# Patient Record
Sex: Male | Born: 1990 | Race: Black or African American | Hispanic: No | Marital: Married | Smoking: Never smoker
Health system: Southern US, Community
[De-identification: ages and names within clinical notes are randomized; demographics above are authoritative.]

---

## 2013-02-10 ENCOUNTER — Ambulatory Visit: Payer: Self-pay | Admitting: Family Medicine

## 2014-03-15 ENCOUNTER — Emergency Department: Payer: Self-pay | Admitting: Emergency Medicine

## 2014-05-04 ENCOUNTER — Emergency Department
Admit: 2014-05-04 | Disposition: A | Discharge status: Home / Self Care | Payer: Self-pay | Admitting: Emergency Medicine

## 2014-05-04 LAB — CBC WITH DIFFERENTIAL/PLATELET
BASOS ABS: 0 10*3/uL (ref 0.0–0.1)
Basophil %: 0.9 %
Eosinophil #: 0.2 10*3/uL (ref 0.0–0.7)
Eosinophil %: 4 %
HCT: 44.4 % (ref 40.0–52.0)
HGB: 14.6 g/dL (ref 13.0–18.0)
LYMPHS ABS: 1.9 10*3/uL (ref 1.0–3.6)
LYMPHS PCT: 34.7 %
MCH: 29.1 pg (ref 26.0–34.0)
MCHC: 32.8 g/dL (ref 32.0–36.0)
MCV: 89 fL (ref 80–100)
Monocyte #: 0.5 x10 3/mm (ref 0.2–1.0)
Monocyte %: 9 %
Neutrophil #: 2.9 10*3/uL (ref 1.4–6.5)
Neutrophil %: 51.4 %
Platelet: 185 10*3/uL (ref 150–440)
RBC: 5 10*6/uL (ref 4.40–5.90)
RDW: 13.9 % (ref 11.5–14.5)
WBC: 5.6 10*3/uL (ref 3.8–10.6)

## 2014-05-04 LAB — BASIC METABOLIC PANEL
Anion Gap: 7 (ref 7–16)
BUN: 13 mg/dL
CALCIUM: 9.1 mg/dL
Chloride: 104 mmol/L
Co2: 28 mmol/L
Creatinine: 0.95 mg/dL
EGFR (African American): >60
EGFR (Non-African Amer.): >60
Glucose: 96 mg/dL
Potassium: 3.4 mmol/L — ABNORMAL LOW
Sodium: 139 mmol/L

## 2014-05-04 LAB — TROPONIN I: Troponin-I: <0.03 ng/mL

## 2014-05-20 ENCOUNTER — Ambulatory Visit: Payer: Self-pay | Admitting: Cardiovascular Disease

## 2014-05-20 ENCOUNTER — Encounter: Payer: Self-pay | Admitting: *Deleted

## 2015-04-12 ENCOUNTER — Encounter: Payer: Self-pay | Admitting: Emergency Medicine

## 2015-04-12 ENCOUNTER — Ambulatory Visit (INDEPENDENT_AMBULATORY_CARE_PROVIDER_SITE_OTHER): Payer: Worker's Compensation

## 2015-04-12 ENCOUNTER — Ambulatory Visit
Admission: EM | Admit: 2015-04-12 | Discharge: 2015-04-12 | Disposition: A | Discharge status: Home / Self Care | Payer: Worker's Compensation | Attending: Family Medicine | Admitting: Family Medicine

## 2015-04-12 DIAGNOSIS — S51811A Laceration without foreign body of right forearm, initial encounter: Secondary | ICD-10-CM

## 2015-04-12 MED ORDER — AZITHROMYCIN 500 MG PO TABS: 500.0000 mg | ORAL_TABLET | Freq: Every day | ORAL | Status: DC

## 2015-04-12 NOTE — ED Provider Notes (Signed)
CSN: [id number]     Arrival date & time 04/12/15  1021 History   First MD Initiated Contact with Patient 04/12/15 1228     Chief Complaint  Patient presents with  . Laceration   (Consider location/radiation/quality/duration/timing/severity/associated sxs/prior Treatment) HPI: Patient presents today with laceration to the right forearm. Patient states that he was cut with glass at work. He states that he was putting a glass frame in the been when it popped up and hit his forearm. He is at work at the time. He denies any other injury. He states his tetanus immunization is up-to-date.  History reviewed. No pertinent past medical history. History reviewed. No pertinent past surgical history. No family history on file. Social History  Substance Use Topics  . Smoking status: Never Smoker   . Smokeless tobacco: None  . Alcohol Use: No    Review of Systems: Negative except mentioned above.   Allergies  Penicillins  Home Medications   Prior to Admission medications   Not on File   Meds Ordered and Administered this Visit  Medications - No data to display  BP 144/95 mmHg  Pulse 86  Temp(Src) 97.8 F (36.6 C) (Oral)  Resp 20  SpO2 100% No data found.   Physical Exam  GENERAL: NAD SKIN: approx. 1.5 in. jagged laceration to the right volar forearm with some swelling around the site, FROM of the RUE including wrist and digits, nv intact  NEURO: CN II-XII grossly intact   ED Course  Procedures (including critical care time)  Labs Review Labs Reviewed - No data to display  Imaging Review No results found.     MDM  A/P: Laceration R Forearm- Risk/benefits discussed. Verbal consent given. Sterile technique used. The area was cleaned initially and lidocaine 1% without epi was used to anesthetize the area. 5-0 nylon was used to close the site with 4 sutures. Patient tolerated procedure well. Dressing was placed. Discussed with patient that I would recommend that the sutures  come out between 10-14 days. Patient will follow-up in 10 days. Will put patient on Zithromax prophy. for a few days given the fact that the patient is allergic to penicillin. Tylenol when necessary pain. Keep the area dry and clean. If any signs of infection or problems he is to follow-up.    Jolene ProvostKirtida Lianny Molter, MD 04/12/15 1505

## 2015-04-12 NOTE — ED Notes (Signed)
Pt with laceration to right forearm. Cut with glass, happened at work and will be [employer].

## 2015-05-01 ENCOUNTER — Ambulatory Visit
Admission: EM | Admit: 2015-05-01 | Discharge: 2015-05-01 | Disposition: A | Discharge status: Home / Self Care | Payer: Worker's Compensation

## 2015-05-01 ENCOUNTER — Encounter: Payer: Self-pay | Admitting: *Deleted

## 2015-05-01 NOTE — ED Notes (Signed)
Pt needs sutures removed from his right arm.  Pt received sutures from a workers comp incident on 04/12/15.

## 2016-03-28 ENCOUNTER — Emergency Department
Admission: EM | Admit: 2016-03-28 | Discharge: 2016-03-29 | Disposition: A | Discharge status: Home / Self Care | Payer: Self-pay | Attending: Emergency Medicine | Admitting: Emergency Medicine

## 2016-03-28 ENCOUNTER — Encounter: Payer: Self-pay | Admitting: Emergency Medicine

## 2016-03-28 DIAGNOSIS — Z5321 Procedure and treatment not carried out due to patient leaving prior to being seen by health care provider: Secondary | ICD-10-CM | POA: Insufficient documentation

## 2016-03-28 DIAGNOSIS — R21 Rash and other nonspecific skin eruption: Secondary | ICD-10-CM | POA: Insufficient documentation

## 2016-03-28 NOTE — ED Triage Notes (Signed)
Pt ambulatory to triage in NAD, reports his girlfriend saw something on pt's back and thought it's a staph infection.  Pt's back inspected, no abnormalities seen. Back palpated, no tenderness.  Pt denies any sx

## 2017-02-20 IMAGING — CR DG FOREARM 2V*R*
2 series · 2 of 2 positions shown · non-contrast
Comparison: None.

CLINICAL DATA: Right forearm laceration on glass at work. Initial
encounter.

EXAM:
RIGHT FOREARM - 2 VIEW

[forearm ap]
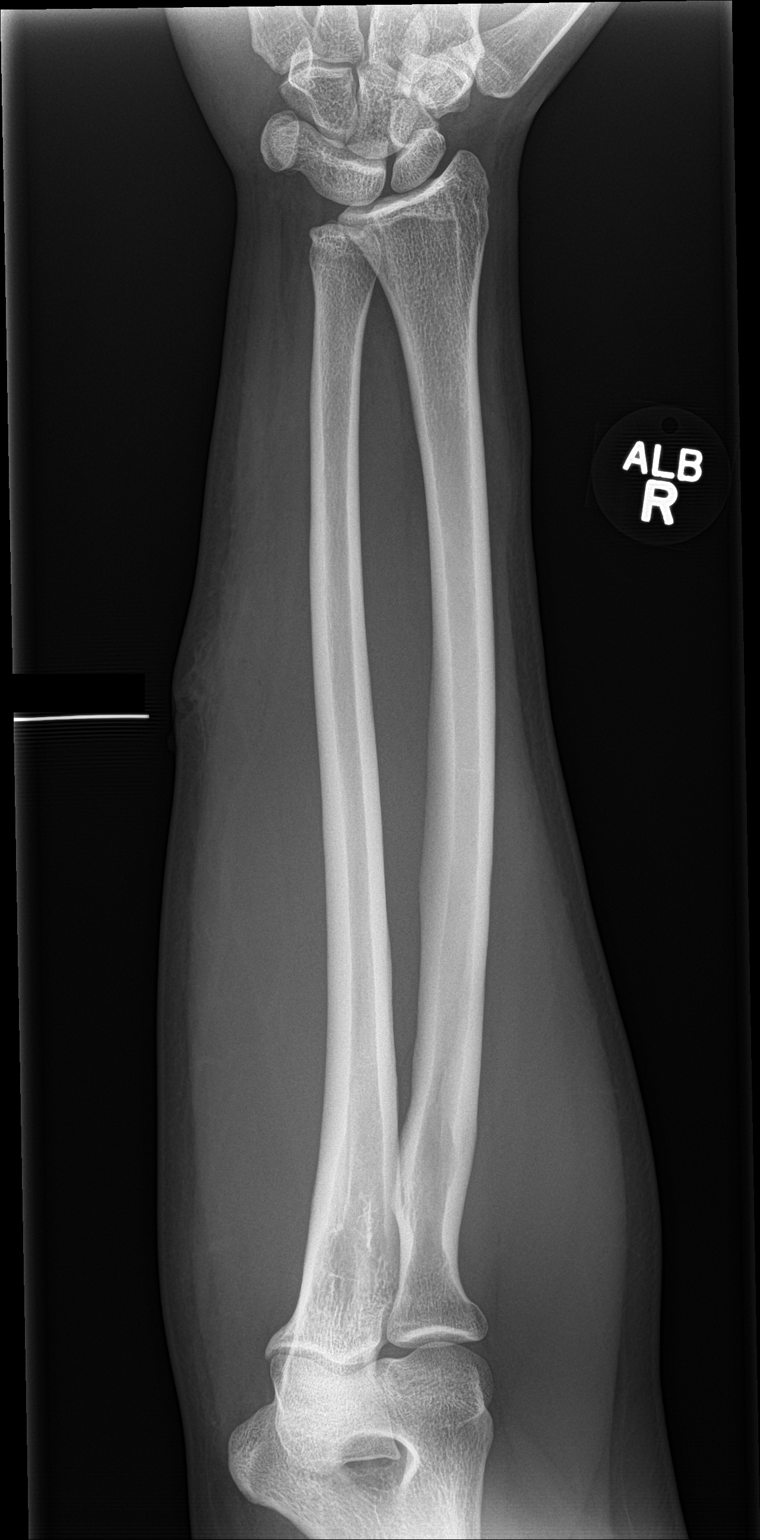

[forearm lat]
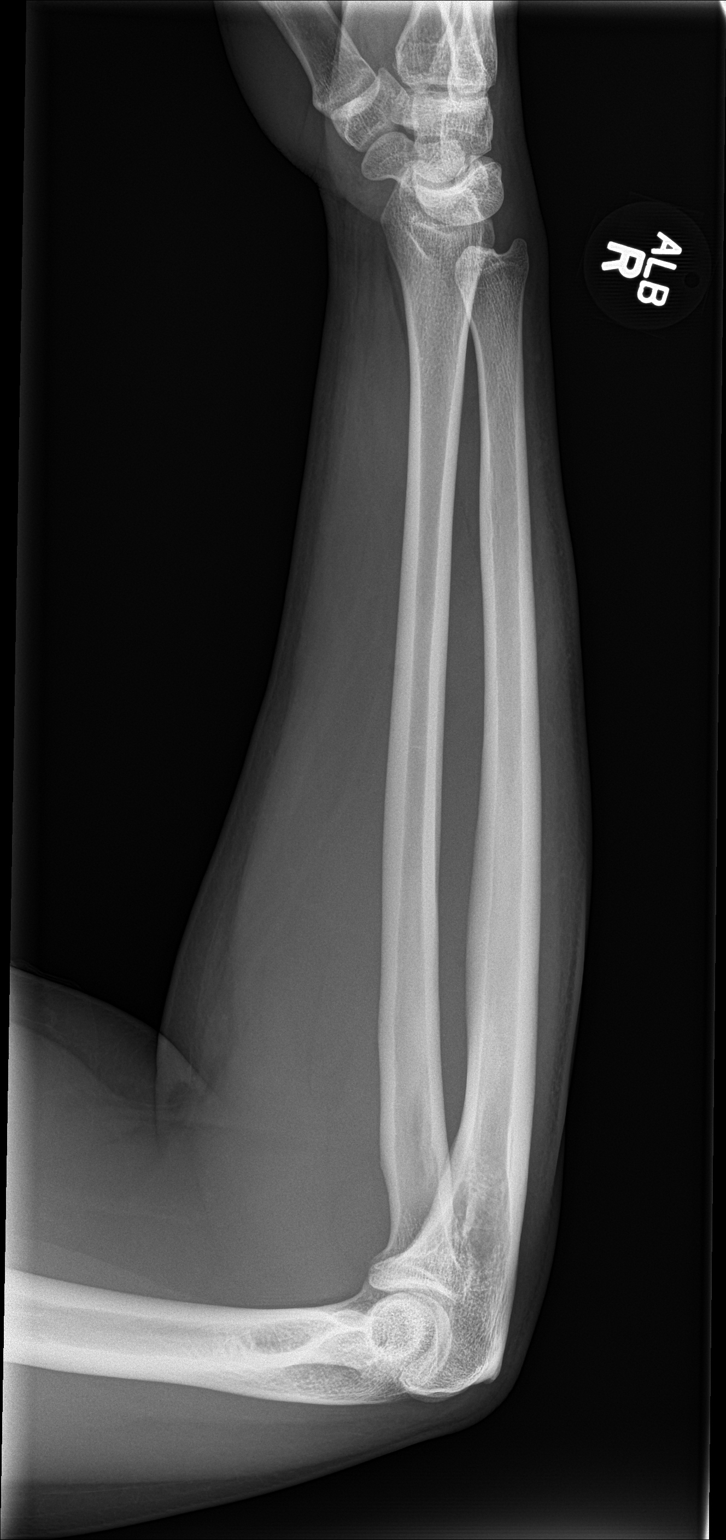

[2 of 2 positions shown; findings below may reference images not displayed]

FINDINGS: There is no evidence of fracture or other focal bone lesions. No
radiopaque foreign body is noted. Laceration is seen in soft tissues
medially.
IMPRESSION: No fracture or dislocation is noted. No radiopaque foreign body is
noted.

## 2018-08-12 ENCOUNTER — Emergency Department
Admission: EM | Admit: 2018-08-12 | Discharge: 2018-08-12 | Disposition: A | Discharge status: Home / Self Care | Payer: Self-pay | Attending: Emergency Medicine | Admitting: Emergency Medicine

## 2018-08-12 ENCOUNTER — Other Ambulatory Visit: Payer: Self-pay

## 2018-08-12 ENCOUNTER — Encounter: Payer: Self-pay | Admitting: Emergency Medicine

## 2018-08-12 DIAGNOSIS — L03317 Cellulitis of buttock: Secondary | ICD-10-CM | POA: Insufficient documentation

## 2018-08-12 MED ORDER — CEPHALEXIN 500 MG PO CAPS: 500.0000 mg | ORAL_CAPSULE | Freq: Three times a day (TID) | ORAL | 0 refills | Status: DC

## 2018-08-12 MED ORDER — SULFAMETHOXAZOLE-TRIMETHOPRIM 800-160 MG PO TABS: 1.0000 | ORAL_TABLET | Freq: Two times a day (BID) | ORAL | 0 refills | Status: DC

## 2018-08-12 NOTE — ED Triage Notes (Signed)
Pt presents to ED via POV with c/o feeling like his tailbone is bruised x several days. Pt denies known injury at this time. Pt ambulatory without difficulty at this time.

## 2018-08-12 NOTE — ED Provider Notes (Signed)
[facility] Emergency Department Provider Note  ____________________________________________  Time seen: Approximately 8:35 PM  I have reviewed the triage vital signs and the nursing notes.   HISTORY  Chief Complaint Back Pain    HPI William Gilbert is a 28 y.o. male presents to the emergency department with concern for a lump along the left buttocks.  Patient states that area has been tender to the touch.  He denies falls or mechanisms of trauma.  Denies having an abscess in location in the past.  Denies a history of recent cutaneous infections.  No alleviating measures have been attempted at home.         History reviewed. No pertinent past medical history.  There are no active problems to display for this patient.   History reviewed. No pertinent surgical history.  Prior to Admission medications   Medication Sig Start Date End Date Taking? Authorizing Provider  azithromycin (ZITHROMAX) 500 MG tablet Take 1 tablet (500 mg total) by mouth daily. 04/12/15   Paulina Fusi, MD  cephALEXin (KEFLEX) 500 MG capsule Take 1 capsule (500 mg total) by mouth 3 (three) times daily for 7 days. 08/12/18 08/19/18  Lannie Fields, PA-C  sulfamethoxazole-trimethoprim (BACTRIM DS) 800-160 MG tablet Take 1 tablet by mouth 2 (two) times daily for 7 days. 08/12/18 08/19/18  Lannie Fields, PA-C    Allergies Penicillins  No family history on file.  Social History Social History   Tobacco Use  . Smoking status: Never Smoker  . Smokeless tobacco: Never Used  Substance Use Topics  . Alcohol use: Yes  . Drug use: Not on file     Review of Systems  Constitutional: No fever/chills Eyes: No visual changes. No discharge ENT: No upper respiratory complaints. Cardiovascular: no chest pain. Respiratory: no cough. No SOB. Gastrointestinal: No abdominal pain.  No nausea, no vomiting.  No diarrhea.  No constipation. Musculoskeletal: Negative for musculoskeletal  pain. Skin: Patient has approximately 2 cm x 2 cm of left buttocks cellulitis. Neurological: Negative for headaches, focal weakness or numbness.   ____________________________________________   PHYSICAL EXAM:  VITAL SIGNS: ED Triage Vitals  Enc Vitals Group     BP 08/12/18 1842 135/88     Pulse Rate 08/12/18 1842 83     Resp 08/12/18 1842 18     Temp 08/12/18 1842 98.3 F (36.8 C)     Temp Source 08/12/18 1842 Oral     SpO2 08/12/18 1842 98 %     Weight 08/12/18 1840 240 lb (108.9 kg)     Height 08/12/18 1840 5\' 8"  (1.727 m)     Head Circumference --      Peak Flow --      Pain Score 08/12/18 1840 5     Pain Loc --      Pain Edu? --      Excl. in [place]? --      Constitutional: Alert and oriented. Well appearing and in no acute distress. Eyes: Conjunctivae are normal. PERRL. EOMI. Head: Atraumatic. Cardiovascular: Normal rate, regular rhythm. Normal S1 and S2.  Good peripheral circulation. Respiratory: Normal respiratory effort without tachypnea or retractions. Lungs CTAB. Good air entry to the bases with no decreased or absent breath sounds. Musculoskeletal: Full range of motion to all extremities. No gross deformities appreciated. Neurologic:  Normal speech and language. No gross focal neurologic deficits are appreciated.  Skin: Patient has 2 cm x 2 cm region of left buttock cellulitis.  There is mild induration  but no palpable fluctuance. Psychiatric: Mood and affect are normal. Speech and behavior are normal. Patient exhibits appropriate insight and judgement.   ____________________________________________   LABS (all labs ordered are listed, but only abnormal results are displayed)  Labs Reviewed - No data to display ____________________________________________  EKG   ____________________________________________  RADIOLOGY  No results found.  ____________________________________________    PROCEDURES  Procedure(s) performed:     Procedures    Medications - No data to display   ____________________________________________   INITIAL IMPRESSION / ASSESSMENT AND PLAN / ED COURSE  Pertinent labs & imaging results that were available during my care of the patient were reviewed by me and considered in my medical decision making (see chart for details).  Review of the [place] CSRS was performed in accordance of the [facility] prior to dispensing any controlled drugs.         Assessment and plan Buttock cellulitis 28 year old male presents to the emergency department with pain of the left buttocks.   Vital signs were stable at triage.  Patient was afebrile.   On physical exam, patient had a 2 cm x 2 cm region of mild induration and erythema consistent with cellulitis of the left buttocks. I am concerned for early abscess formation but affected area is not conducive to incision and drainage at this time.  Patient was started empirically on Bactrim and Keflex.  Patient was cautioned to return to the emergency department if symptoms worsen.  He felt comfortable being discharged from the emergency department.  All patient questions were answered.   ____________________________________________  FINAL CLINICAL IMPRESSION(S) / ED DIAGNOSES  Final diagnoses:  Cellulitis of buttock      NEW MEDICATIONS STARTED DURING THIS VISIT:  ED Discharge Orders         Ordered    sulfamethoxazole-trimethoprim (BACTRIM DS) 800-160 MG tablet  2 times daily     08/12/18 2032    cephALEXin (KEFLEX) 500 MG capsule  3 times daily     08/12/18 2032              This chart was dictated using voice recognition software/Dragon. Despite best efforts to proofread, errors can occur which can change the meaning. Any change was purely unintentional.    Gasper LloydWoods, Darsi Tien M, PA-C 08/12/18 2038    Concha SeFunke, Mary E, MD 08/13/18 [phone]

## 2018-08-14 ENCOUNTER — Encounter: Payer: Self-pay | Admitting: Emergency Medicine

## 2018-08-14 ENCOUNTER — Other Ambulatory Visit: Payer: Self-pay

## 2018-08-14 DIAGNOSIS — L0501 Pilonidal cyst with abscess: Secondary | ICD-10-CM | POA: Insufficient documentation

## 2018-08-14 DIAGNOSIS — L0231 Cutaneous abscess of buttock: Secondary | ICD-10-CM | POA: Diagnosis present

## 2018-08-14 LAB — COMPREHENSIVE METABOLIC PANEL
ALT: 46 U/L — ABNORMAL HIGH (ref 0–44)
AST: 31 U/L (ref 15–41)
Albumin: 4.6 g/dL (ref 3.5–5.0)
Alkaline Phosphatase: 70 U/L (ref 38–126)
Anion gap: 12 (ref 5–15)
BUN: 16 mg/dL (ref 6–20)
CO2: 24 mmol/L (ref 22–32)
Calcium: 9.3 mg/dL (ref 8.9–10.3)
Chloride: 100 mmol/L (ref 98–111)
Creatinine, Ser: 1.31 mg/dL — ABNORMAL HIGH (ref 0.61–1.24)
GFR calc Af Amer: >60 mL/min (ref >60)
GFR calc non Af Amer: >60 mL/min (ref >60)
Glucose, Bld: 112 mg/dL — ABNORMAL HIGH (ref 70–99)
Potassium: 3.9 mmol/L (ref 3.5–5.1)
Sodium: 136 mmol/L (ref 135–145)
Total Bilirubin: 0.9 mg/dL (ref 0.3–1.2)
Total Protein: 8.5 g/dL — ABNORMAL HIGH (ref 6.5–8.1)

## 2018-08-14 LAB — CBC WITH DIFFERENTIAL/PLATELET
Abs Immature Granulocytes: 0.03 10*3/uL (ref 0.00–0.07)
Basophils Absolute: 0.1 10*3/uL (ref 0.0–0.1)
Basophils Relative: 1 %
Eosinophils Absolute: 0.2 10*3/uL (ref 0.0–0.5)
Eosinophils Relative: 2 %
HCT: 43.8 % (ref 39.0–52.0)
Hemoglobin: 14.5 g/dL (ref 13.0–17.0)
Immature Granulocytes: 0 %
Lymphocytes Relative: 20 %
Lymphs Abs: 2.5 10*3/uL (ref 0.7–4.0)
MCH: 28.6 pg (ref 26.0–34.0)
MCHC: 33.1 g/dL (ref 30.0–36.0)
MCV: 86.4 fL (ref 80.0–100.0)
Monocytes Absolute: 1.3 10*3/uL — ABNORMAL HIGH (ref 0.1–1.0)
Monocytes Relative: 10 %
Neutro Abs: 8.7 10*3/uL — ABNORMAL HIGH (ref 1.7–7.7)
Neutrophils Relative %: 67 %
Platelets: 245 10*3/uL (ref 150–400)
RBC: 5.07 MIL/uL (ref 4.22–5.81)
RDW: 12.7 % (ref 11.5–15.5)
WBC: 12.8 10*3/uL — ABNORMAL HIGH (ref 4.0–10.5)
nRBC: 0 % (ref 0.0–0.2)

## 2018-08-14 LAB — LACTIC ACID, PLASMA: Lactic Acid, Venous: 1.2 mmol/L (ref 0.5–1.9)

## 2018-08-14 MED ORDER — ACETAMINOPHEN 325 MG PO TABS
650.0000 mg | ORAL_TABLET | Freq: Once | ORAL | Status: AC | PRN
  Administered 2018-08-14: 650 mg via ORAL
  Filled 2018-08-14: qty 2

## 2018-08-14 NOTE — ED Notes (Signed)
Sent redraw green top to lab

## 2018-08-14 NOTE — ED Triage Notes (Addendum)
Pt here today for abscess like area to left upper buttocks.  Has increased in size despite abx started 2 days ago from here (bactrim/kelflex).  Pt has noted fever in triage although denied known fever.  No other symptoms other than pain from abscess.  No vomiting. Did not meet sepsis criteria on triage screen but first set blood cx drawn and sent to lab if needed with lab draw.

## 2018-08-15 ENCOUNTER — Emergency Department
Admission: EM | Admit: 2018-08-15 | Discharge: 2018-08-15 | Disposition: A | Discharge status: Home / Self Care | Payer: BC Managed Care – PPO | Attending: Emergency Medicine | Admitting: Emergency Medicine

## 2018-08-15 DIAGNOSIS — L0501 Pilonidal cyst with abscess: Secondary | ICD-10-CM

## 2018-08-15 MED ORDER — OXYCODONE-ACETAMINOPHEN 5-325 MG PO TABS
2.0000 | ORAL_TABLET | Freq: Once | ORAL | Status: AC
  Administered 2018-08-15: 2 via ORAL
  Filled 2018-08-15: qty 2

## 2018-08-15 MED ORDER — METHYLPREDNISOLONE SODIUM SUCC 125 MG IJ SOLR
INTRAMUSCULAR | Status: AC
  Filled 2018-08-15: qty 2

## 2018-08-15 MED ORDER — DOCUSATE SODIUM 100 MG PO CAPS: 100.0000 mg | ORAL_CAPSULE | Freq: Every day | ORAL | 2 refills | Status: AC | PRN

## 2018-08-15 MED ORDER — DIPHENHYDRAMINE HCL 50 MG/ML IJ SOLN
25.0000 mg | Freq: Once | INTRAMUSCULAR | Status: AC
  Administered 2018-08-15: 02:00:00 25 mg via INTRAVENOUS

## 2018-08-15 MED ORDER — HYDROMORPHONE HCL 1 MG/ML IJ SOLN
0.5000 mg | Freq: Once | INTRAMUSCULAR | Status: AC
  Administered 2018-08-15: 0.5 mg via INTRAVENOUS
  Filled 2018-08-15: qty 1

## 2018-08-15 MED ORDER — DOCUSATE SODIUM 100 MG PO CAPS
100.0000 mg | ORAL_CAPSULE | Freq: Once | ORAL | Status: AC
  Administered 2018-08-15: 100 mg via ORAL
  Filled 2018-08-15: qty 1

## 2018-08-15 MED ORDER — DIPHENHYDRAMINE HCL 50 MG/ML IJ SOLN
INTRAMUSCULAR | Status: AC
  Administered 2018-08-15: 25 mg via INTRAVENOUS
  Filled 2018-08-15: qty 1

## 2018-08-15 MED ORDER — VANCOMYCIN HCL IN DEXTROSE 1-5 GM/200ML-% IV SOLN
1000.0000 mg | Freq: Once | INTRAVENOUS | Status: AC
  Administered 2018-08-15: 1000 mg via INTRAVENOUS
  Filled 2018-08-15: qty 200

## 2018-08-15 MED ORDER — LIDOCAINE-EPINEPHRINE 1 %-1:100000 IJ SOLN
10.0000 mL | Freq: Once | INTRAMUSCULAR | Status: DC
  Filled 2018-08-15: qty 10

## 2018-08-15 MED ORDER — OXYCODONE-ACETAMINOPHEN 5-325 MG PO TABS: 1.0000 | ORAL_TABLET | Freq: Three times a day (TID) | ORAL | 0 refills | Status: DC | PRN

## 2018-08-15 NOTE — ED Notes (Addendum)
Pt called out reporting that he is having an allergic reaction to his antibiotic. IV stopped. MD aware.

## 2018-08-15 NOTE — ED Provider Notes (Signed)
[facility] Emergency Department Provider Note       Time seen: ----------------------------------------- 12:27 AM on 08/15/2018 -----------------------------------------   I have reviewed the triage vital signs and the nursing notes.  HISTORY   Chief Complaint Back Pain    HPI William Gilbert is a 28 y.o. male with no known past medical history who presents to the ED for an abscess on his left upper buttocks.  Patient states this is increased in size despite antibiotics that were started 2 days ago.  He had a fever on arrival here although he did not know that he had one.  History reviewed. No pertinent past medical history.  There are no active problems to display for this patient.   History reviewed. No pertinent surgical history.  Allergies Penicillins  Social History Social History   Tobacco Use  . Smoking status: Never Smoker  . Smokeless tobacco: Never Used  Substance Use Topics  . Alcohol use: Yes  . Drug use: Not on file    Review of Systems Constitutional: Positive for fever Cardiovascular: Negative for chest pain. Respiratory: Negative for shortness of breath. Gastrointestinal: Negative for abdominal pain, vomiting and diarrhea. Musculoskeletal: Positive for buttocks pain Skin: Positive for skin abscess Neurological: Negative for headaches, focal weakness or numbness.  All systems negative/normal/unremarkable except as stated in the HPI  ____________________________________________   PHYSICAL EXAM:  VITAL SIGNS: ED Triage Vitals  Enc Vitals Group     BP 08/14/18 1834 121/88     Pulse Rate 08/14/18 1834 (!) 107     Resp 08/14/18 1834 18     Temp 08/14/18 1834 (!) 101.9 F (38.8 C)     Temp Source 08/14/18 1946 Oral     SpO2 08/14/18 1834 96 %     Weight 08/14/18 1835 240 lb (108.9 kg)     Height 08/14/18 1835 5\' 8"  (1.727 m)     Head Circumference --      Peak Flow --      Pain Score 08/14/18 1835 6      Pain Loc --      Pain Edu? --      Excl. in [place]? --    Constitutional: Alert and oriented. Well appearing and in no distress. Cardiovascular: Normal rate, regular rhythm. No murmurs, rubs, or gallops. Respiratory: Normal respiratory effort without tachypnea nor retractions. Breath sounds are clear and equal bilaterally. No wheezes/rales/rhonchi. Gastrointestinal: Soft and nontender. Normal bowel sounds Genitourinary: Large pilonidal abscess is noted at the apex of his gluteal cleft Musculoskeletal: Nontender with normal range of motion in extremities. No lower extremity tenderness nor edema. Neurologic:  Normal speech and language. No gross focal neurologic deficits are appreciated.  Skin:  Skin is warm, dry and intact. No rash noted. Psychiatric: Mood and affect are normal. Speech and behavior are normal.  ____________________________________________  ED COURSE:  As part of my medical decision making, I reviewed the following data within the electronic MEDICAL RECORD NUMBER History obtained from family if available, nursing notes, old chart and ekg, as well as notes from prior ED visits. Patient presented for skin abscess, we will assess with labs and imaging as indicated at this time.   Marland Kitchen..Incision and Drainage  Date/Time: 08/15/2018 12:33 AM Performed by: Emily FilbertWilliams,  E, MD Authorized by: Emily FilbertWilliams,  E, MD   Consent:    Consent obtained:  Verbal   Consent given by:  Patient   Risks discussed:  Bleeding, infection, incomplete drainage and pain   Alternatives  discussed:  Alternative treatment, delayed treatment and observation Location:    Type:  Pilonidal cyst   Size:  Large   Location:  Anogenital   Anogenital location:  Pilonidal Anesthesia (see MAR for exact dosages):    Anesthesia method:  Local infiltration   Local anesthetic:  Lidocaine 1% WITH epi Procedure type:    Complexity:  Complex Procedure details:    Incision types:  Single straight   Scalpel blade:   11   Wound management:  Probed and deloculated   Drainage:  Purulent   Drainage amount:  Copious   Wound treatment:  Drain placed   Packing materials:  1/4 in gauze Post-procedure details:    Patient tolerance of procedure:  Tolerated well, no immediate complications    William Gilbert was evaluated in Emergency Department on 08/15/2018 for the symptoms described in the history of present illness. He was evaluated in the context of the global COVID-19 pandemic, which necessitated consideration that the patient might be at risk for infection with the SARS-CoV-2 virus that causes COVID-19. Institutional protocols and algorithms that pertain to the evaluation of patients at risk for COVID-19 are in a state of rapid change based on information released by regulatory bodies including the CDC and federal and state organizations. These policies and algorithms were followed during the patient's care in the ED.  ____________________________________________   LABS (pertinent positives/negatives)  Labs Reviewed  CBC WITH DIFFERENTIAL/PLATELET - Abnormal; Notable for the following components:      Result Value   WBC 12.8 (*)    Neutro Abs 8.7 (*)    Monocytes Absolute 1.3 (*)    All other components within normal limits  COMPREHENSIVE METABOLIC PANEL - Abnormal; Notable for the following components:   Glucose, Bld 112 (*)    Creatinine, Ser 1.31 (*)    Total Protein 8.5 (*)    ALT 46 (*)    All other components within normal limits  CULTURE, BLOOD (ROUTINE X 2)  CULTURE, BLOOD (ROUTINE X 2)  LACTIC ACID, PLASMA  LACTIC ACID, PLASMA   ____________________________________________   DIFFERENTIAL DIAGNOSIS   Pilonidal abscess, cellulitis  FINAL ASSESSMENT AND PLAN  Pilonidal abscess   Plan: The patient had presented for worsening pilonidal abscess. Patient's labs revealed leukocytosis without any other obvious acute abnormality.  Patient was given IV vancomycin as well as IV  Dilaudid.  Wound was incised and drained as dictated above.  I have encouraged follow-up in 2 days for recheck.   Laurence Aly, MD    Note: This note was generated in part or whole with voice recognition software. Voice recognition is usually quite accurate but there are transcription errors that can and very often do occur. I apologize for any typographical errors that were not detected and corrected.     Earleen Newport, MD 08/15/18 [phone]

## 2018-08-15 NOTE — ED Notes (Signed)
Dr. Williams at the bedside.  

## 2018-08-15 NOTE — ED Notes (Signed)
Pt resting on stretcher with eyes closed and even respirations. No distress noted. Pt states his pain has improved slightly. prepared pt for I and D.

## 2018-08-17 ENCOUNTER — Other Ambulatory Visit: Payer: Self-pay

## 2018-08-17 ENCOUNTER — Encounter: Payer: Self-pay | Admitting: Emergency Medicine

## 2018-08-17 ENCOUNTER — Emergency Department
Admission: EM | Admit: 2018-08-17 | Discharge: 2018-08-17 | Disposition: A | Discharge status: Home / Self Care | Payer: BC Managed Care – PPO | Attending: Emergency Medicine | Admitting: Emergency Medicine

## 2018-08-17 DIAGNOSIS — Z4800 Encounter for change or removal of nonsurgical wound dressing: Secondary | ICD-10-CM | POA: Insufficient documentation

## 2018-08-17 DIAGNOSIS — Z79899 Other long term (current) drug therapy: Secondary | ICD-10-CM | POA: Diagnosis not present

## 2018-08-17 DIAGNOSIS — Z09 Encounter for follow-up examination after completed treatment for conditions other than malignant neoplasm: Secondary | ICD-10-CM

## 2018-08-17 NOTE — ED Notes (Signed)
See triage note  Here for wound check  Had abscess area to lower back

## 2018-08-17 NOTE — ED Triage Notes (Signed)
Wound check from I+D site.  Patient denies complaint.

## 2018-08-17 NOTE — ED Provider Notes (Signed)
[facility] Emergency Department Provider Note ____________________________________________  Time seen: 1745  I have reviewed the triage vital signs and the nursing notes.  HISTORY  Chief Complaint  Wound Check  HPI Davey Eugenie Birks is a 28 y.o. male presents himself to the ED for interim wound check.  Patient is about 3 days out from an I&D procedure to the left buttocks at the gluteal cleft.  He reports that the wound had been draining as appropriate he has had no intermittent complaints.  He has been tolerating the antibiotic as prescribed.  History reviewed. No pertinent past medical history.  There are no active problems to display for this patient.  History reviewed. No pertinent surgical history.  Prior to Admission medications   Medication Sig Start Date End Date Taking? Authorizing Provider  docusate sodium (COLACE) 100 MG capsule Take 1 capsule (100 mg total) by mouth daily as needed. 08/15/18 08/15/19  Earleen Newport, MD    Allergies Penicillins and Vancomycin  No family history on file.  Social History Social History   Tobacco Use  . Smoking status: Never Smoker  . Smokeless tobacco: Never Used  Substance Use Topics  . Alcohol use: Yes  . Drug use: Not on file    Review of Systems  Constitutional: Negative for fever. Eyes: Negative for visual changes. ENT: Negative for sore throat. Cardiovascular: Negative for chest pain. Respiratory: Negative for shortness of breath. Gastrointestinal: Negative for abdominal pain, vomiting and diarrhea. Genitourinary: Negative for dysuria. Musculoskeletal: Negative for back pain. Skin: Negative for rash.  Gluteal abscess as above. Neurological: Negative for headaches, focal weakness or numbness. ____________________________________________  PHYSICAL EXAM:  VITAL SIGNS: ED Triage Vitals  Enc Vitals Group     BP 08/17/18 1657 117/73     Pulse Rate 08/17/18 1652 96     Resp 08/17/18  1652 16     Temp 08/17/18 1652 98.8 F (37.1 C)     Temp Source 08/17/18 1652 Oral     SpO2 08/17/18 1652 96 %     Weight 08/17/18 1651 240 lb 1.3 oz (108.9 kg)     Height 08/17/18 1651 5\' 8"  (1.727 m)     Head Circumference --      Peak Flow --      Pain Score 08/17/18 1651 0     Pain Loc --      Pain Edu? --      Excl. in [place]? --     Constitutional: Alert and oriented. Well appearing and in no distress. Head: Normocephalic and atraumatic. Cardiovascular: Normal rate, regular rhythm. Normal distal pulses. Respiratory: Normal respiratory effort. Musculoskeletal: Nontender with normal range of motion in all extremities.  Neurologic:  Normal gait without ataxia. Normal speech and language. No gross focal neurologic deficits are appreciated. Skin:  Skin is warm, dry and intact. No rash noted.  Well-healing abscess to the left buttocks at the gluteal cleft.  Central abscess bed is draining minimal amount of purulence and surrounding skin is still firm and indurated.  No fluctuance or erythema is noted. ___________________________________________  PROCEDURES  Procedures Packing removal ____________________________________________  INITIAL IMPRESSION / ASSESSMENT AND PLAN / ED COURSE  Gerrick Eugenie Birks was evaluated in Emergency Department on 08/17/2018 for the symptoms described in the history of present illness. He was evaluated in the context of the global COVID-19 pandemic, which necessitated consideration that the patient might be at risk for infection with the SARS-CoV-2 virus that causes COVID-19. Institutional protocols and algorithms  that pertain to the evaluation of patients at risk for COVID-19 are in a state of rapid change based on information released by regulatory bodies including the CDC and federal and state organizations. These policies and algorithms were followed during the patient's care in the ED.  Patient with ED evaluation and interim wound check on her previously  I&D abscess.  Patient without any interim complaints.  Wound is healing well without signs of infection.  Patient is encouraged to keep the wound clean, dry, and covered.  He should apply warm compress to promote healing.  He will continue with antibiotic as prescribed.  Return precautions have been reviewed. ____________________________________________  FINAL CLINICAL IMPRESSION(S) / ED DIAGNOSES  Final diagnoses:  Encounter for recheck of abscess following incision and drainage      Karmen StabsMenshew, Charlesetta IvoryJenise V Bacon, PA-C 08/17/18 Norva [id number]    Paduchowski, Kevin, MD 08/17/18 2154

## 2018-08-17 NOTE — Discharge Instructions (Addendum)
Keep your wound covered and apply warm compresses to the area to promote healing. Complete the antibiotic as directed. Return to the ED as needed.

## 2018-08-19 LAB — CULTURE, BLOOD (ROUTINE X 2)
Culture: NO GROWTH
Culture: NO GROWTH

## 2019-01-25 ENCOUNTER — Ambulatory Visit: Payer: BC Managed Care – PPO | Attending: Internal Medicine

## 2019-01-25 DIAGNOSIS — Z20822 Contact with and (suspected) exposure to covid-19: Secondary | ICD-10-CM | POA: Insufficient documentation

## 2019-01-26 LAB — NOVEL CORONAVIRUS, NAA: SARS-CoV-2, NAA: NOT DETECTED

## 2019-04-14 ENCOUNTER — Ambulatory Visit: Payer: BC Managed Care – PPO | Attending: Internal Medicine

## 2019-04-14 DIAGNOSIS — Z20822 Contact with and (suspected) exposure to covid-19: Secondary | ICD-10-CM

## 2019-04-15 LAB — NOVEL CORONAVIRUS, NAA: SARS-CoV-2, NAA: NOT DETECTED

## 2019-05-26 ENCOUNTER — Encounter: Payer: Self-pay | Admitting: Emergency Medicine

## 2019-05-26 ENCOUNTER — Emergency Department
Admission: EM | Admit: 2019-05-26 | Discharge: 2019-05-26 | Disposition: A | Discharge status: Home / Self Care | Payer: BC Managed Care – PPO | Attending: Emergency Medicine | Admitting: Emergency Medicine

## 2019-05-26 ENCOUNTER — Other Ambulatory Visit: Payer: Self-pay

## 2019-05-26 DIAGNOSIS — R21 Rash and other nonspecific skin eruption: Secondary | ICD-10-CM | POA: Insufficient documentation

## 2019-05-26 DIAGNOSIS — I10 Essential (primary) hypertension: Secondary | ICD-10-CM

## 2019-05-26 NOTE — ED Provider Notes (Signed)
[facility] Emergency Department Provider Note   ____________________________________________    I have reviewed the triage vital signs and the nursing notes.   HISTORY  Chief Complaint Hypertension, Headache, and Rash     HPI William Gilbert is a 29 y.o. male who presents with complaints of high blood pressure and rash.  Patient reports that he developed a rash over the last couple of days in his hands and feet.  He denies fevers or chills has had a mild headache.  No loss of taste or smell.  No nausea or vomiting.  No chest pain or shortness of breath or cough.  No throat pain.  Went to urgent care and they drew blood work, I have been able to review it and it was reassuring.  They did start him on amlodipine for elevated blood pressure and start him on antibiotics.  He states that he came here for a second opinion.  History reviewed. No pertinent past medical history.  There are no problems to display for this patient.   History reviewed. No pertinent surgical history.  Prior to Admission medications   Medication Sig Start Date End Date Taking? Authorizing Provider  docusate sodium (COLACE) 100 MG capsule Take 1 capsule (100 mg total) by mouth daily as needed. 08/15/18 08/15/19  Earleen Newport, MD     Allergies Penicillins and Vancomycin  No family history on file.  Social History Social History   Tobacco Use  . Smoking status: Never Smoker  . Smokeless tobacco: Never Used  Substance Use Topics  . Alcohol use: Yes  . Drug use: Not on file    Review of Systems  Constitutional: No fever/chills Eyes: No visual changes.  ENT: No sore throat. Cardiovascular: Denies chest pain. Respiratory: Denies shortness of breath. Gastrointestinal: No abdominal pain.  No nausea, no vomiting.   Genitourinary: Negative for dysuria. Musculoskeletal: Negative for back pain. Skin: As above Neurological: As  above   ____________________________________________   PHYSICAL EXAM:  VITAL SIGNS: ED Triage Vitals  Enc Vitals Group     BP 05/26/19 1101 (!) 151/100     Pulse Rate 05/26/19 1101 (!) 105     Resp 05/26/19 1101 16     Temp 05/26/19 1101 98.6 F (37 C)     Temp Source 05/26/19 1101 Oral     SpO2 05/26/19 1101 99 %     Weight 05/26/19 1103 119.7 kg (264 lb)     Height 05/26/19 1103 1.753 m (5\' 9" )     Head Circumference --      Peak Flow --      Pain Score 05/26/19 1102 4     Pain Loc --      Pain Edu? --      Excl. in [place]? --     Constitutional: Alert and oriented.  Nose: No congestion/rhinnorhea. Mouth/Throat: Mucous membranes are moist.  No intraoral lesions  Cardiovascular: Normal rate, regular rhythm.  Good peripheral circulation. Respiratory: Normal respiratory effort.  No retractions. Gastrointestinal: Soft and nontender. No distention.   Musculoskeletal: .  Warm and well perfused Neurologic:  Normal speech and language. No gross focal neurologic deficits are appreciated.  Skin:  Skin is warm, dry and intact.  Macular rash noted to the hands and feet, Palmar and plantar surfaces only.  No throat lesions Psychiatric: Mood and affect are normal. Speech and behavior are normal.  ____________________________________________   LABS (all labs ordered are listed, but only abnormal results are  displayed)  Labs Reviewed - No data to display ____________________________________________  EKG   ____________________________________________  RADIOLOGY   ____________________________________________   PROCEDURES  Procedure(s) performed: No  Procedures   Critical Care performed: No ____________________________________________   INITIAL IMPRESSION / ASSESSMENT AND PLAN / ED COURSE  Pertinent labs & imaging results that were available during my care of the patient were reviewed by me and considered in my medical decision making (see chart for  details).  Patient well-appearing and in no acute distress, mildly hypertensive reassuring neuro exam.  Lab work is unremarkable.  Agree with antihypertensives, outpatient follow-up, no further testing required here in the emergency department    ____________________________________________   FINAL CLINICAL IMPRESSION(S) / ED DIAGNOSES  Final diagnoses:  Rash  Essential hypertension        Note:  This document was prepared using Dragon voice recognition software and may include unintentional dictation errors.   Jene Every, MD 05/26/19 1520

## 2019-05-26 NOTE — ED Triage Notes (Addendum)
Sent from [facility] for high blood pressure 170/120.  He went there for headache for 3-4 days and a rash on hands and feet.   He had blood work, Publishing copy and chest xray done t here and labwork and provider note is available in care everywhere.  He has instructions for syphilis, but he was not treated until blood work comes back for that.  He also received rx for bp med.

## 2021-03-27 ENCOUNTER — Encounter: Payer: Self-pay | Admitting: *Deleted

## 2021-03-27 ENCOUNTER — Emergency Department
Admission: EM | Admit: 2021-03-27 | Discharge: 2021-03-27 | Disposition: A | Discharge status: Home / Self Care | Payer: No Typology Code available for payment source | Attending: Emergency Medicine | Admitting: Emergency Medicine

## 2021-03-27 ENCOUNTER — Other Ambulatory Visit: Payer: Self-pay

## 2021-03-27 DIAGNOSIS — Y93G3 Activity, cooking and baking: Secondary | ICD-10-CM | POA: Insufficient documentation

## 2021-03-27 DIAGNOSIS — Y92009 Unspecified place in unspecified non-institutional (private) residence as the place of occurrence of the external cause: Secondary | ICD-10-CM | POA: Diagnosis not present

## 2021-03-27 DIAGNOSIS — W228XXA Striking against or struck by other objects, initial encounter: Secondary | ICD-10-CM | POA: Insufficient documentation

## 2021-03-27 DIAGNOSIS — S61204A Unspecified open wound of right ring finger without damage to nail, initial encounter: Secondary | ICD-10-CM | POA: Insufficient documentation

## 2021-03-27 DIAGNOSIS — S6991XA Unspecified injury of right wrist, hand and finger(s), initial encounter: Secondary | ICD-10-CM | POA: Diagnosis present

## 2021-03-27 DIAGNOSIS — S61209A Unspecified open wound of unspecified finger without damage to nail, initial encounter: Secondary | ICD-10-CM

## 2021-03-27 MED ORDER — LIDOCAINE-EPINEPHRINE (PF) 2 %-1:200000 IJ SOLN
10.0000 mL | Freq: Once | INTRAMUSCULAR | Status: AC
  Administered 2021-03-27: 10 mL
  Filled 2021-03-27: qty 20

## 2021-03-27 MED ORDER — DOXYCYCLINE HYCLATE 100 MG PO TABS: 100.0000 mg | ORAL_TABLET | Freq: Two times a day (BID) | ORAL | 0 refills | Status: AC

## 2021-03-27 NOTE — ED Triage Notes (Signed)
First Nurse Note: Pt from home, last night he was preparing dinner and sliced right ring finger while cooking. Bleeding under control ?

## 2021-03-27 NOTE — ED Triage Notes (Signed)
Pt has avulsion to right fourth finger.  Pt cut on a slicer last night.  Bleeding controlled  pt alert ?

## 2021-03-27 NOTE — Discharge Instructions (Addendum)
-  Place the dressing daily on this wound.  Applied topical antibiotic ointment with every dressing change. ?-You may wash the affected area with soap and water daily. ?-If you begin to notice signs of infection (redness, swelling, discharge), you may begin taking the 10-day course of antibiotics prescribed you ?

## 2021-03-27 NOTE — ED Provider Notes (Signed)
? ?[facility] ?Provider Note ? ? ? Event Date/Time  ? First MD Initiated Contact with Patient 03/27/21 1749   ?  (approximate) ? ? ?History  ? ?Chief Complaint ?Laceration ? ? ?HPI ?William Gilbert is a 31 y.o. male, no remarkable medical history, presents to the emergency department for evaluation of finger injury.  Patient states that he was cooking last night when he accidentally hit his finger goes through a slicer, reportedly causing a chunk of his right ring finger finger to get sliced of.  He states that bleeding continues despite pressure.  Denies fever/chills, lightheadedness, fatigue, decreased range of motion, hand/wrist injury, or numbness/tingling in affected digit.  He states that he is up-to-date on his tetanus. ? ?History Limitations: No limitations. ? ?  ? ? ?Physical Exam  ?Triage Vital Signs: ?ED Triage Vitals [03/27/21 1741]  ?Enc Vitals Group  ?   BP (!) 152/93  ?   Pulse Rate 73  ?   Resp 18  ?   Temp 98.3 ?F (36.8 ?C)  ?   Temp Source Oral  ?   SpO2 98 %  ?   Weight 249 lb (112.9 kg)  ?   Height 5\' 9"  (1.753 m)  ?   Head Circumference   ?   Peak Flow   ?   Pain Score 5  ?   Pain Loc   ?   Pain Edu?   ?   Excl. in [place]?   ? ? ?Most recent vital signs: ?Vitals:  ? 03/27/21 1741  ?BP: (!) 152/93  ?Pulse: 73  ?Resp: 18  ?Temp: 98.3 ?F (36.8 ?C)  ?SpO2: 98%  ? ? ?General: Awake, NAD.  ?CV: Good peripheral perfusion.  ?Resp: Normal effort.  ?Abd: Soft, non-tender. No distention.  ?Neuro: At baseline. No gross neurological deficits.  ?Other: Fourth digit appears to have a 1.5 cm avulsion, 2 mm deep just distal to the PIP joint.  Mild oozing noted.  Patient maintains normal flexion and extension of the digit.  Sensation and cap refill intact.  No evidence of foreign body.  Unable to approximate wound edges. ? ?Physical Exam ? ? ? ?ED Results / Procedures / Treatments  ?Labs ?(all labs ordered are listed, but only abnormal results are displayed) ?Labs Reviewed - No data to  display ? ? ?EKG ?Not applicable. ? ? ?RADIOLOGY ? ?ED Provider Interpretation: Not applicable ? ?No results found. ? ?PROCEDURES: ? ?Critical Care performed: Not applicable. ? ?Procedures ? ? ? ?MEDICATIONS ORDERED IN ED: ?Medications  ?lidocaine-EPINEPHrine (XYLOCAINE W/EPI) 2 %-1:200000 (PF) injection 10 mL (10 mLs Infiltration Given 03/27/21 1900)  ? ? ? ?IMPRESSION / MDM / ASSESSMENT AND PLAN / ED COURSE  ?I reviewed the triage vital signs and the nursing notes. ?             ?               ? ? ?Differential diagnosis includes, but is not limited to, finger laceration/avulsion, extensor tendon injury, ? ?Assessment/Plan ?Patient presents with avulsion of the right fourth digit.  Applied gauze soaked with topical lidocaine with epinephrine for bleeding control, which was successful.  Wound edges are not able to be approximated for suturing.  We will have to heal via secondary intention.  Applied sterile dressing around the wound.  Encourage patient to wash with soap/water daily and change dressing daily and apply topical antibiotic ointment.  We will provide him with a prescription for  doxycycline to be taken the patient begins to see signs of infection.  We will discharge this patient. ? ?Patient was provided with anticipatory guidance, return precautions, and educational material. Encouraged the patient to return to the emergency department at any time if they begin to experience any new or worsening symptoms.  ? ?  ? ? ?FINAL CLINICAL IMPRESSION(S) / ED DIAGNOSES  ? ?Final diagnoses:  ?Avulsion of finger, initial encounter  ? ? ? ?Rx / DC Orders  ? ?ED Discharge Orders   ? ?      Ordered  ?  doxycycline (VIBRA-TABS) 100 MG tablet  2 times daily       ? 03/27/21 1930  ? ?  ?  ? ?  ? ? ? ?Note:  This document was prepared using Dragon voice recognition software and may include unintentional dictation errors. ?  ?Varney Daily, [place] ?03/27/21 2316 ? ?  ?Minna Antis, MD ?03/27/21 2336 ? ?

## 2021-03-27 NOTE — ED Notes (Signed)
Pt verbalized understanding of discharge.

## 2021-06-13 ENCOUNTER — Emergency Department
Admission: EM | Admit: 2021-06-13 | Discharge: 2021-06-13 | Disposition: A | Discharge status: Home / Self Care | Payer: No Typology Code available for payment source | Attending: Emergency Medicine | Admitting: Emergency Medicine

## 2021-06-13 ENCOUNTER — Other Ambulatory Visit: Payer: Self-pay

## 2021-06-13 DIAGNOSIS — I10 Essential (primary) hypertension: Secondary | ICD-10-CM | POA: Insufficient documentation

## 2021-06-13 DIAGNOSIS — R112 Nausea with vomiting, unspecified: Secondary | ICD-10-CM

## 2021-06-13 DIAGNOSIS — R42 Dizziness and giddiness: Secondary | ICD-10-CM

## 2021-06-13 LAB — URINALYSIS, ROUTINE W REFLEX MICROSCOPIC
Bilirubin Urine: NEGATIVE
Glucose, UA: NEGATIVE mg/dL
Hgb urine dipstick: NEGATIVE
Ketones, ur: NEGATIVE mg/dL
Leukocytes,Ua: NEGATIVE
Nitrite: NEGATIVE
Protein, ur: NEGATIVE mg/dL
Specific Gravity, Urine: 1.011 (ref 1.005–1.030)
pH: 8 (ref 5.0–8.0)

## 2021-06-13 LAB — COMPREHENSIVE METABOLIC PANEL
ALT: 42 U/L (ref 0–44)
AST: 26 U/L (ref 15–41)
Albumin: 4.6 g/dL (ref 3.5–5.0)
Alkaline Phosphatase: 49 U/L (ref 38–126)
Anion gap: 9 (ref 5–15)
BUN: 14 mg/dL (ref 6–20)
CO2: 30 mmol/L (ref 22–32)
Calcium: 9.5 mg/dL (ref 8.9–10.3)
Chloride: 98 mmol/L (ref 98–111)
Creatinine, Ser: 0.94 mg/dL (ref 0.61–1.24)
GFR, Estimated: >60 mL/min (ref >60)
Glucose, Bld: 91 mg/dL (ref 70–99)
Potassium: 3.4 mmol/L — ABNORMAL LOW (ref 3.5–5.1)
Sodium: 137 mmol/L (ref 135–145)
Total Bilirubin: 0.5 mg/dL (ref 0.3–1.2)
Total Protein: 8.2 g/dL — ABNORMAL HIGH (ref 6.5–8.1)

## 2021-06-13 LAB — LIPASE, BLOOD: Lipase: 45 U/L (ref 11–51)

## 2021-06-13 LAB — CBC
HCT: 46.8 % (ref 39.0–52.0)
Hemoglobin: 15.2 g/dL (ref 13.0–17.0)
MCH: 28.3 pg (ref 26.0–34.0)
MCHC: 32.5 g/dL (ref 30.0–36.0)
MCV: 87 fL (ref 80.0–100.0)
Platelets: 221 10*3/uL (ref 150–400)
RBC: 5.38 MIL/uL (ref 4.22–5.81)
RDW: 12.8 % (ref 11.5–15.5)
WBC: 7.3 10*3/uL (ref 4.0–10.5)
nRBC: 0 % (ref 0.0–0.2)

## 2021-06-13 MED ORDER — ONDANSETRON 4 MG PO TBDP: 4.0000 mg | ORAL_TABLET | Freq: Three times a day (TID) | ORAL | 0 refills | Status: AC | PRN

## 2021-06-13 NOTE — ED Triage Notes (Signed)
Pt reports not feeling well this am, lightheaded and groggy, states that he took his wife to her appt and tried to lay back in the car but grew nauseated and vomited, pt states now he feels better, no dizzy or nauseated but wife wanted him to get checked out

## 2021-06-13 NOTE — ED Provider Notes (Signed)
[facility] Provider Note    Event Date/Time   First MD Initiated Contact with Patient 06/13/21 1401     (approximate)   History   Chief Complaint: Emesis and Dizziness   HPI  William Gilbert is a 31 y.o. male with past history of hypertension and migraines who comes ED complaining of an episode of dizziness nausea and vomiting that started while he was waiting in the car during his wife's dental procedure.  He reports that before that he had eaten chicken wings and 2 Oreos.  After throwing up, he felt much better and now feels back to normal.  No lightheadedness with standing, no chest pain shortness of breath fever or cough.  He had no preceding symptoms.  He has been compliant with medications.     Physical Exam   Triage Vital Signs: ED Triage Vitals [06/13/21 1217]  Enc Vitals Group     BP (!) 133/95     Pulse Rate 85     Resp 16     Temp 98.1 F (36.7 C)     Temp Source Oral     SpO2 99 %     Weight 243 lb (110.2 kg)     Height 5\' 9"  (1.753 m)     Head Circumference      Peak Flow      Pain Score 0     Pain Loc      Pain Edu?      Excl. in [place]?     Most recent vital signs: Vitals:   06/13/21 1217  BP: (!) 133/95  Pulse: 85  Resp: 16  Temp: 98.1 F (36.7 C)  SpO2: 99%    General: Awake, no distress.  CV:  Good peripheral perfusion.  Regular rate and rhythm Resp:  Normal effort.  Clear to auscultation bilaterally Abd:  No distention.  Soft nontender Other:  No lower extremity edema, no rash.  Moist oral mucosa.   ED Results / Procedures / Treatments   Labs (all labs ordered are listed, but only abnormal results are displayed) Labs Reviewed  COMPREHENSIVE METABOLIC PANEL - Abnormal; Notable for the following components:      Result Value   Potassium 3.4 (*)    Total Protein 8.2 (*)    All other components within normal limits  URINALYSIS, ROUTINE W REFLEX MICROSCOPIC - Abnormal; Notable for the following components:    Color, Urine STRAW (*)    APPearance CLEAR (*)    All other components within normal limits  LIPASE, BLOOD  CBC     EKG Interpreted by me Normal sinus rhythm rate of 80.  Normal axis and intervals.  Normal QRS ST segments and T waves.   RADIOLOGY    PROCEDURES:  Procedures   MEDICATIONS ORDERED IN ED: Medications - No data to display   IMPRESSION / MDM / ASSESSMENT AND PLAN / ED COURSE  I reviewed the triage vital signs and the nursing notes.                              Differential diagnosis includes, but is not limited to, vertigo, foodborne illness, GERD, doubt cholecystitis choledocholithiasis pancreatitis GI perforation.  Patient's presentation is most consistent with acute complicated illness / injury requiring diagnostic workup.  Patient presents with a brief episode of dizziness, nausea and vomiting after eating lunch.  He is now feeling back to normal.  Vital signs are  unremarkable, exam is reassuring, serum labs and urinalysis are normal.  Not requiring admission due to his reassuring presentation.       FINAL CLINICAL IMPRESSION(S) / ED DIAGNOSES   Final diagnoses:  Dizziness  Nausea and vomiting, unspecified vomiting type     Rx / DC Orders   ED Discharge Orders          Ordered    ondansetron (ZOFRAN-ODT) 4 MG disintegrating tablet  Every 8 hours PRN        06/13/21 1527             Note:  This document was prepared using Dragon voice recognition software and may include unintentional dictation errors.   Sharman Cheek, MD 06/13/21 1531

## 2021-06-13 NOTE — ED Notes (Signed)
Pt  was taking his wife to an appt and felt lightheaded with an episode of N/V, states after he vomited he felt better, denies any sx at this time.Marland Kitchen
